# Patient Record
Sex: Female | Born: 1995 | Race: Black or African American | Hispanic: No | Marital: Single | Smoking: Never smoker
Health system: Southern US, Community
[De-identification: ages and names within clinical notes are randomized; demographics above are authoritative.]

---
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
## 2015-01-03 ENCOUNTER — Emergency Department (HOSPITAL_COMMUNITY)
Admission: EM | Admit: 2015-01-03 | Discharge: 2015-01-03 | Disposition: A | Discharge status: Home / Self Care | Payer: Medicaid Other | Attending: Emergency Medicine | Admitting: Emergency Medicine

## 2015-01-03 ENCOUNTER — Encounter (HOSPITAL_COMMUNITY): Payer: Self-pay | Admitting: Emergency Medicine

## 2015-01-03 DIAGNOSIS — L0211 Cutaneous abscess of neck: Secondary | ICD-10-CM | POA: Insufficient documentation

## 2015-01-03 DIAGNOSIS — Z792 Long term (current) use of antibiotics: Secondary | ICD-10-CM | POA: Insufficient documentation

## 2015-01-03 DIAGNOSIS — Z79899 Other long term (current) drug therapy: Secondary | ICD-10-CM | POA: Diagnosis not present

## 2015-01-03 DIAGNOSIS — R220 Localized swelling, mass and lump, head: Secondary | ICD-10-CM | POA: Diagnosis present

## 2015-01-03 MED ORDER — LIDOCAINE HCL 2 % IJ SOLN
INTRAMUSCULAR | Status: AC
Start: 2015-01-03 — End: 2015-01-03
  Administered 2015-01-03: 400 mg
  Filled 2015-01-03: qty 20

## 2015-01-03 MED ORDER — TRAMADOL HCL 50 MG PO TABS: 50.0000 mg | ORAL_TABLET | Freq: Four times a day (QID) | ORAL | Status: DC | PRN

## 2015-01-03 MED ORDER — LIDOCAINE HCL 2 % IJ SOLN: 20.0000 mL | Freq: Once | INTRAMUSCULAR | Status: DC

## 2015-01-03 MED ORDER — LIDOCAINE HCL 2 % IJ SOLN: 10.0000 mL | Freq: Once | INTRAMUSCULAR | Status: DC

## 2015-01-03 MED ORDER — SULFAMETHOXAZOLE-TRIMETHOPRIM 800-160 MG PO TABS: 1.0000 | ORAL_TABLET | Freq: Two times a day (BID) | ORAL | Status: AC

## 2015-01-03 NOTE — Discharge Instructions (Signed)
Abscess °Care After °An abscess (also called a boil or furuncle) is an infected area that contains a collection of pus. Signs and symptoms of an abscess include pain, tenderness, redness, or hardness, or you may feel a moveable soft area under your skin. An abscess can occur anywhere in the body. The infection may spread to surrounding tissues causing cellulitis. A cut (incision) by the surgeon was made over your abscess and the pus was drained out. Gauze may have been packed into the space to provide a drain that will allow the cavity to heal from the inside outwards. The boil may be painful for 5 to 7 days. Most people with a boil do not have high fevers. Your abscess, if seen early, may not have localized, and may not have been lanced. If not, another appointment may be required for this if it does not get better on its own or with medications. °HOME CARE INSTRUCTIONS  °· Only take over-the-counter or prescription medicines for pain, discomfort, or fever as directed by your caregiver. °· When you bathe, soak and then remove gauze or iodoform packs at least daily or as directed by your caregiver. You may then wash the wound gently with mild soapy water. Repack with gauze or do as your caregiver directs. °SEEK IMMEDIATE MEDICAL CARE IF:  °· You develop increased pain, swelling, redness, drainage, or bleeding in the wound site. °· You develop signs of generalized infection including muscle aches, chills, fever, or a general ill feeling. °· An oral temperature above 102° F (38.9° C) develops, not controlled by medication. °See your caregiver for a recheck if you develop any of the symptoms described above. If medications (antibiotics) were prescribed, take them as directed. °Document Released: 04/12/2005 Document Revised: 12/17/2011 Document Reviewed: 12/08/2007 °ExitCare® Patient Information ©2015 [employer]. This information is not intended to replace advice given to you by your health care provider. Make sure  you discuss any questions you have with your health care provider. ° °Cellulitis °Cellulitis is an infection of the skin and the tissue under the skin. The infected area is usually red and tender. This happens most often in the arms and lower legs. °HOME CARE  °· Take your antibiotic medicine as told. Finish the medicine even if you start to feel better. °· Keep the infected arm or leg raised (elevated). °· Put a warm cloth on the area up to 4 times per day. °· Only take medicines as told by your doctor. °· Keep all doctor visits as told. °GET HELP IF: °· You see red streaks on the skin coming from the infected area. °· Your red area gets bigger or turns a dark color. °· Your bone or joint under the infected area is painful after the skin heals. °· Your infection comes back in the same area or different area. °· You have a puffy (swollen) bump in the infected area. °· You have new symptoms. °· You have a fever. °GET HELP RIGHT AWAY IF:  °· You feel very sleepy. °· You throw up (vomit) or have watery poop (diarrhea). °· You feel sick and have muscle aches and pains. °MAKE SURE YOU:  °· Understand these instructions. °· Will watch your condition. °· Will get help right away if you are not doing well or get worse. °Document Released: 03/12/2008 Document Revised: 02/08/2014 Document Reviewed: 12/10/2011 °ExitCare® Patient Information ©2015 [employer]. This information is not intended to replace advice given to you by your health care provider. Make sure you discuss   any questions you have with your health care provider. ° °

## 2015-01-03 NOTE — ED Provider Notes (Signed)
CSN: [id number]     Arrival date & time 01/03/15  1052 History   First MD Initiated Contact with Patient 01/03/15 1056     Chief Complaint  Patient presents with  . Facial Swelling    pain in swollen area behind l/ear     (Consider location/radiation/quality/duration/timing/severity/associated sxs/prior Treatment) HPI Pt is an 19yo female presenting to ED with c/o gradually worsening mass behind her left ear that started about 18 hours ago.  Pt states the mass and pain have been worsening. Pain is aching, throbbing, and sharp, 10/10, worse with palpation. Denies bleeding or drainage from mass. Denies inner ear pain. Denies dental pain. No fever, chills, n/v/d. Denies hx of similar symptoms. Pt does report having her hair braided tightly about 3 weeks ago but no chemicals were used. No new soaps or lotions. Denies new ear piercing.   History reviewed. No pertinent past medical history. History reviewed. No pertinent past surgical history. History reviewed. No pertinent family history. History  Substance Use Topics  . Smoking status: Never Smoker   . Smokeless tobacco: Never Used  . Alcohol Use: No   OB History    No data available     Review of Systems  Constitutional: Negative for fever and chills.  HENT: Negative for sore throat and trouble swallowing.   Respiratory: Negative for shortness of breath.   Gastrointestinal: Negative for nausea and vomiting.  Musculoskeletal: Positive for neck pain ( left side, behind left ear). Negative for myalgias, back pain, arthralgias and neck stiffness.  Skin: Positive for color change and wound.  All other systems reviewed and are negative.     Allergies  Review of patient's allergies indicates no known allergies.  Home Medications   Prior to Admission medications   Medication Sig Start Date End Date Taking? Authorizing Provider  ibuprofen (ADVIL,MOTRIN) 800 MG tablet Take 1 tablet (800 mg total) by mouth every 8 (eight) hours as  needed. Patient not taking: Reported on 11/03/2014 05/24/14   Charlestine Nighthristopher Lawyer, PA-C  medroxyPROGESTERone (DEPO-PROVERA) 150 MG/ML injection Inject 150 mg into the muscle every 3 (three) months.    Historical Provider, MD  sulfamethoxazole-trimethoprim (BACTRIM DS,SEPTRA DS) 800-160 MG per tablet Take 1 tablet by mouth 2 (two) times daily. 01/03/15 01/10/15  Junius FinnerErin O'Malley, PA-C  traMADol (ULTRAM) 50 MG tablet Take 1 tablet (50 mg total) by mouth every 6 (six) hours as needed. 01/03/15   Junius FinnerErin O'Malley, PA-C   BP 126/77 mmHg  Pulse 92  Temp(Src) 98.3 F (36.8 C) (Oral)  Resp 16  SpO2 100%  LMP 12/13/2014 (Exact Date) Physical Exam  Constitutional: She is oriented to person, place, and time. She appears well-developed and well-nourished.  HENT:  Head: Normocephalic and atraumatic.  Right Ear: Hearing, tympanic membrane, external ear and ear canal normal.  Left Ear: Hearing, tympanic membrane, external ear and ear canal normal.  Eyes: EOM are normal.  Neck: Normal range of motion. Neck supple.  Mass behind left ear, see skin exam  Cardiovascular: Normal rate.   Pulmonary/Chest: Effort normal.  Musculoskeletal: Normal range of motion.  Neurological: She is alert and oriented to person, place, and time.  Skin: Skin is warm and dry. There is erythema.  1cm round tender, erythematous mass behind left ear. Mild fluctuance in center of lesion. No active drainage or bleeding. No red streaking.  Psychiatric: She has a normal mood and affect. Her behavior is normal.  Nursing note and vitals reviewed.   ED Course  Procedures   INCISION  AND DRAINAGE Performed by: Junius Finner A. Consent: Verbal consent obtained. Risks and benefits: risks, benefits and alternatives were discussed Type: abscess  Body area: behind left ear  Anesthesia: local infiltration  Incision was made with a scalpel.  Local anesthetic: lidocaine 2% without epinephrine  Anesthetic total: 0.5 ml  Complexity:  complex Blunt dissection to break up loculations  Drainage: bloody  Drainage amount: scant  Packing material: bandage placed  Patient tolerance: Patient tolerated the procedure well with no immediate complications.     Labs Review Labs Reviewed - No data to display  Imaging Review No results found.   EKG Interpretation None      MDM   Final diagnoses:  Abscess of skin of neck   Pt presenting to ED with c/o gradually worsening painful mass behind her left ear.  Inner ear appears normal. No erythema or bulging.  Discussed pt with Dr. Estell Harpin who also examined mass. Recommended performing and I&D as mass c/w small abscess.  Bloody discharge during procedure.  Discussed use of warm compresses. Will also start pt on bactrim and have f/u with PCP, Holland Commons in 2 days for wound recheck. Pt verbalized understanding and agreement with tx plan.   Junius Finner, PA-C 01/03/15 1140  Bethann Berkshire, MD 01/04/15 [phone]

## 2015-01-03 NOTE — ED Notes (Signed)
Pt c/o small swollen, tender area behind l/ear x 18 hours

## 2015-01-03 NOTE — ED Notes (Signed)
Patient was having a video conversation while this tech was getting vitals.

## 2015-01-12 ENCOUNTER — Ambulatory Visit: Admitting: Internal Medicine

## 2015-06-30 ENCOUNTER — Encounter (HOSPITAL_COMMUNITY): Payer: Self-pay

## 2015-06-30 ENCOUNTER — Emergency Department (HOSPITAL_COMMUNITY)
Admission: EM | Admit: 2015-06-30 | Discharge: 2015-06-30 | Disposition: A | Discharge status: Home / Self Care | Payer: Medicaid Other | Attending: Emergency Medicine | Admitting: Emergency Medicine

## 2015-06-30 DIAGNOSIS — N390 Urinary tract infection, site not specified: Secondary | ICD-10-CM | POA: Insufficient documentation

## 2015-06-30 DIAGNOSIS — Z79899 Other long term (current) drug therapy: Secondary | ICD-10-CM | POA: Diagnosis not present

## 2015-06-30 DIAGNOSIS — R300 Dysuria: Secondary | ICD-10-CM | POA: Diagnosis present

## 2015-06-30 LAB — URINE MICROSCOPIC-ADD ON

## 2015-06-30 LAB — URINALYSIS, ROUTINE W REFLEX MICROSCOPIC
BILIRUBIN URINE: NEGATIVE
Glucose, UA: NEGATIVE mg/dL
Ketones, ur: NEGATIVE mg/dL
Nitrite: NEGATIVE
PH: 6 (ref 5.0–8.0)
Protein, ur: 100 mg/dL — AB
SPECIFIC GRAVITY, URINE: 1.036 — AB (ref 1.005–1.030)
Urobilinogen, UA: 1 mg/dL (ref 0.0–1.0)

## 2015-06-30 MED ORDER — CEPHALEXIN 500 MG PO CAPS
500.0000 mg | ORAL_CAPSULE | Freq: Once | ORAL | Status: AC
  Administered 2015-06-30: 500 mg via ORAL
  Filled 2015-06-30: qty 1

## 2015-06-30 MED ORDER — FLUCONAZOLE 150 MG PO TABS
150.0000 mg | ORAL_TABLET | Freq: Once | ORAL | Status: AC
  Administered 2015-06-30: 150 mg via ORAL
  Filled 2015-06-30: qty 1

## 2015-06-30 MED ORDER — CEPHALEXIN 500 MG PO CAPS
500.0000 mg | ORAL_CAPSULE | Freq: Three times a day (TID) | ORAL | Status: DC
Start: 2015-06-30 — End: 2015-08-28

## 2015-06-30 NOTE — ED Notes (Signed)
Pt with increased urinary frequency. Decreased volume x 2 days

## 2015-06-30 NOTE — ED Provider Notes (Signed)
CSN: [id number]     Arrival date & time 06/30/15  1706 History   First MD Initiated Contact with Patient 06/30/15 1801     Chief Complaint  Patient presents with  . Dysuria     (Consider location/radiation/quality/duration/timing/severity/associated sxs/prior Treatment) HPI  19 yo F w/ 2 days of worsening decreased uop and urgency. No fevers, nausea, vomiting. No back pain, suprapubic pain or other associated symptoms.   History reviewed. No pertinent past medical history. History reviewed. No pertinent past surgical history. History reviewed. No pertinent family history. Social History  Substance Use Topics  . Smoking status: Never Smoker   . Smokeless tobacco: Never Used  . Alcohol Use: No   OB History    No data available     Review of Systems  Constitutional: Negative for fever, chills and activity change.  Gastrointestinal: Negative for diarrhea.  Genitourinary: Positive for dysuria, decreased urine volume and difficulty urinating. Negative for urgency, pelvic pain and dyspareunia.  All other systems reviewed and are negative.     Allergies  Review of patient's allergies indicates no known allergies.  Home Medications   Prior to Admission medications   Medication Sig Start Date End Date Taking? Authorizing Provider  levonorgestrel (MIRENA) 20 MCG/24HR IUD 1 each by Intrauterine route once.   Yes Historical Provider, MD  Multiple Vitamin (MULTIVITAMIN WITH MINERALS) TABS tablet Take 1 tablet by mouth daily.   Yes Historical Provider, MD  cephALEXin (KEFLEX) 500 MG capsule Take 1 capsule (500 mg total) by mouth 3 (three) times daily. 06/30/15   Marily Memos, MD   BP 126/83 mmHg  Pulse 80  Temp(Src) 98.1 F (36.7 C) (Oral)  Resp 16  Ht  (1.676 m)  Wt 119 lb (53.978 kg)  BMI 19.22 kg/m2  SpO2 99% Physical Exam  Constitutional: She is oriented to person, place, and time. She appears well-developed and well-nourished.  HENT:  Head: Normocephalic and  atraumatic.  Eyes: Conjunctivae and EOM are normal. Right eye exhibits no discharge. Left eye exhibits no discharge.  Cardiovascular: Normal rate and regular rhythm.   Pulmonary/Chest: Effort normal and breath sounds normal. No respiratory distress.  Abdominal: Soft. She exhibits no distension. There is no tenderness. There is no rebound.  Musculoskeletal: Normal range of motion. She exhibits no edema or tenderness.  Neurological: She is alert and oriented to person, place, and time.  Skin: Skin is warm and dry.  Nursing note and vitals reviewed.   ED Course  Procedures (including critical care time) Labs Review Labs Reviewed  URINALYSIS, ROUTINE W REFLEX MICROSCOPIC (NOT AT [facility]) - Abnormal; Notable for the following:    Color, Urine AMBER (*)    APPearance TURBID (*)    Specific Gravity, Urine 1.036 (*)    Hgb urine dipstick LARGE (*)    Protein, ur 100 (*)    Leukocytes, UA LARGE (*)    All other components within normal limits  URINE MICROSCOPIC-ADD ON - Abnormal; Notable for the following:    Bacteria, UA FEW (*)    All other components within normal limits  POC URINE PREG, ED    Imaging Review No results found. I have personally reviewed and evaluated these images and lab results as part of my medical decision-making.   EKG Interpretation None      MDM   Final diagnoses:  UTI (lower urinary tract infection)   Here with UTI. Also with blood so will need PCP follow up. No pain to suggest renal colic.  I have personally and contemperaneously reviewed labs and imaging and used in my decision making as above.   A medical screening exam was performed and I feel the patient has had an appropriate workup for their chief complaint at this time and likelihood of emergent condition existing is low. They have been counseled on decision, discharge, follow up and which symptoms necessitate immediate return to the emergency department. They or their family verbally stated  understanding and agreement with plan and discharged in stable condition.      Marily Memos, MD 06/30/15 [phone]

## 2015-08-28 ENCOUNTER — Emergency Department (HOSPITAL_COMMUNITY)
Admission: EM | Admit: 2015-08-28 | Discharge: 2015-08-28 | Disposition: A | Discharge status: Home / Self Care | Payer: Medicaid Other | Attending: Emergency Medicine | Admitting: Emergency Medicine

## 2015-08-28 ENCOUNTER — Encounter (HOSPITAL_COMMUNITY): Payer: Self-pay | Admitting: Oncology

## 2015-08-28 ENCOUNTER — Emergency Department (HOSPITAL_COMMUNITY): Payer: Medicaid Other

## 2015-08-28 DIAGNOSIS — W228XXA Striking against or struck by other objects, initial encounter: Secondary | ICD-10-CM | POA: Insufficient documentation

## 2015-08-28 DIAGNOSIS — Y9389 Activity, other specified: Secondary | ICD-10-CM | POA: Diagnosis not present

## 2015-08-28 DIAGNOSIS — Y998 Other external cause status: Secondary | ICD-10-CM | POA: Insufficient documentation

## 2015-08-28 DIAGNOSIS — S6992XA Unspecified injury of left wrist, hand and finger(s), initial encounter: Secondary | ICD-10-CM | POA: Diagnosis present

## 2015-08-28 DIAGNOSIS — S60411A Abrasion of left index finger, initial encounter: Secondary | ICD-10-CM | POA: Insufficient documentation

## 2015-08-28 DIAGNOSIS — S60222A Contusion of left hand, initial encounter: Secondary | ICD-10-CM | POA: Diagnosis not present

## 2015-08-28 DIAGNOSIS — Y9289 Other specified places as the place of occurrence of the external cause: Secondary | ICD-10-CM | POA: Insufficient documentation

## 2015-08-28 DIAGNOSIS — S60512A Abrasion of left hand, initial encounter: Secondary | ICD-10-CM

## 2015-08-28 MED ORDER — ACETAMINOPHEN 500 MG PO TABS: 1000.0000 mg | ORAL_TABLET | Freq: Once | ORAL | Status: DC

## 2015-08-28 MED ORDER — BACITRACIN ZINC 500 UNIT/GM EX OINT: 1.0000 "application " | TOPICAL_OINTMENT | Freq: Two times a day (BID) | CUTANEOUS | Status: DC

## 2015-08-28 NOTE — Discharge Instructions (Signed)
1. Medications: bacitracin, usual home medications 2. Treatment: rest, drink plenty of fluids, keep wound clean and dry, use ice 3. Follow Up: Please followup with your primary doctor in 3 days for discussion of your diagnoses and further evaluation after today's visit; if you do not have a primary care doctor use the resource guide provided to find one; Please return to the ER for worsening symptoms, symptoms of infection   Abrasion An abrasion is a cut or scrape on the outer surface of your skin. An abrasion does not extend through all of the layers of your skin. It is important to care for your abrasion properly to prevent infection. CAUSES Most abrasions are caused by falling on or gliding across the ground or another surface. When your skin rubs on something, the outer and inner layer of skin rubs off.  SYMPTOMS A cut or scrape is the main symptom of this condition. The scrape may be bleeding, or it may appear red or pink. If there was an associated fall, there may be an underlying bruise. DIAGNOSIS An abrasion is diagnosed with a physical exam. TREATMENT Treatment for this condition depends on how large and deep the abrasion is. Usually, your abrasion will be cleaned with water and mild soap. This removes any dirt or debris that may be stuck. An antibiotic ointment may be applied to the abrasion to help prevent infection. A bandage (dressing) may be placed on the abrasion to keep it clean. You may also need a tetanus shot. HOME CARE INSTRUCTIONS Medicines  Take or apply medicines only as directed by your health care provider.  If you were prescribed an antibiotic ointment, finish all of it even if you start to feel better. Wound Care  Clean the wound with mild soap and water 2-3 times per day or as directed by your health care provider. Pat your wound dry with a clean towel. Do not rub it.  There are many different ways to close and cover a wound. Follow instructions from your health  care provider about:  Wound care.  Dressing changes and removal.  Check your wound every day for signs of infection. Watch for:  Redness, swelling, or pain.  Fluid, blood, or pus. General Instructions  Keep the dressing dry as directed by your health care provider. Do not take baths, swim, use a hot tub, or do anything that would put your wound underwater until your health care provider approves.  If there is swelling, raise (elevate) the injured area above the level of your heart while you are sitting or lying down.  Keep all follow-up visits as directed by your health care provider. This is important. SEEK MEDICAL CARE IF:  You received a tetanus shot and you have swelling, severe pain, redness, or bleeding at the injection site.  Your pain is not controlled with medicine.  You have increased redness, swelling, or pain at the site of your wound. SEEK IMMEDIATE MEDICAL CARE IF:  You have a red streak going away from your wound.  You have a fever.  You have fluid, blood, or pus coming from your wound.  You notice a bad smell coming from your wound or your dressing.   This information is not intended to replace advice given to you by your health care provider. Make sure you discuss any questions you have with your health care provider.   Document Released: 07/04/2005 Document Revised: 06/15/2015 Document Reviewed: 09/22/2014 Elsevier Interactive Patient Education [employer]2016 Elsevier Inc.

## 2015-08-28 NOTE — ED Notes (Addendum)
Pt presents d/t left handed pain after hitting a wall because she was mad.  Pt states that it feels like she has a rubber band around her thumb.  Pain is rated 8/10, throbbing in nature. No obvious deformity.  Cap refill WNL.

## 2015-08-28 NOTE — ED Provider Notes (Signed)
CSN: [id number]     Arrival date & time 08/28/15  0035 History   First MD Initiated Contact with Patient 08/28/15 0109     Chief Complaint  Patient presents with  . Hand Injury     (Consider location/radiation/quality/duration/timing/severity/associated sxs/prior Treatment) Patient is a 19 y.o. female presenting with hand injury. The history is provided by the patient and medical records. No language interpreter was used.  Hand Injury Associated symptoms: no back pain, no fever and no neck pain      Peggy Weeks is a 19 y.o. female  with no major medical history presents to the Emergency Department complaining of gradual, persistent, progressively worsening left hand pain after punching a wall onset 3 hours prior to arrival.  Patient describes the pain as throbbing, rated at an 8 out of 10. Patient also reports pain to the left thumb.  Patient with abrasion over the MCP of the left pointer finger. Patient is adamant that this is not a fight bite. She states multiple times that she only punched a wall. Treatments prior to arrival. Nothing makes it better or worse. Patient denies numbness, tingling, weakness. Patient does not know of her tetanus is up-to-date.   History reviewed. No pertinent past medical history. History reviewed. No pertinent past surgical history. No family history on file. Social History  Substance Use Topics  . Smoking status: Never Smoker   . Smokeless tobacco: Never Used  . Alcohol Use: No   OB History    No data available     Review of Systems  Constitutional: Negative for fever and chills.  Gastrointestinal: Negative for nausea and vomiting.  Musculoskeletal: Positive for arthralgias. Negative for back pain, joint swelling, neck pain and neck stiffness.  Skin: Positive for wound.  Neurological: Negative for numbness.  Hematological: Does not bruise/bleed easily.  Psychiatric/Behavioral: The patient is not nervous/anxious.   All other systems reviewed  and are negative.     Allergies  Review of patient's allergies indicates no known allergies.  Home Medications   Prior to Admission medications   Medication Sig Start Date End Date Taking? Authorizing Provider  bacitracin ointment Apply 1 application topically 2 (two) times daily. 08/28/15   Yadira Hada, PA-C  levonorgestrel (MIRENA) 20 MCG/24HR IUD 1 each by Intrauterine route once.    Historical Provider, MD   BP 135/83 mmHg  Pulse 81  Temp(Src) 97.8 F (36.6 C) (Oral)  Resp 17  SpO2 100% Physical Exam  Constitutional: She appears well-developed and well-nourished. No distress.  HENT:  Head: Normocephalic and atraumatic.  Eyes: Conjunctivae are normal.  Neck: Normal range of motion.  Cardiovascular: Normal rate, regular rhythm and intact distal pulses.   Capillary refill < 3 sec  Pulmonary/Chest: Effort normal and breath sounds normal.  Musculoskeletal: She exhibits tenderness. She exhibits no edema.  ROM: Full range of motion of the left shoulder, elbow, wrist and all fingers of the left hand Tenderness to palpation over the MCP and along the medial aspect of the left thumb without palpable deformity  Neurological: She is alert. Coordination normal.  Sensation intact to dull and sharp Strength 5/5 including strong grip strength and flexion and extension of all fingers  Skin: Skin is warm and dry. She is not diaphoretic.  No tenting of the skin Abrasion noted over the dorsal side of the MCP of the left pointer finger  Psychiatric: She has a normal mood and affect.  Nursing note and vitals reviewed.   ED Course  Procedures (  including critical care time) Labs Review Labs Reviewed - No data to display  Imaging Review Dg Hand Complete Left  08/28/2015  CLINICAL DATA:  Left hand/thumb pain after injury.  Punched a wall. EXAM: LEFT HAND - COMPLETE 3+ VIEW COMPARISON:  None. FINDINGS: No fracture or dislocation. The alignment and joint spaces are maintained.  Particularly, the thumb is intact. No focal soft tissue abnormality. IMPRESSION: No fracture dislocation of the left hand. Electronically Signed   By: Rubye OaksMelanie  Ehinger M.D.   On: 08/28/2015 01:16   I have personally reviewed and evaluated these images and lab results as part of my medical decision-making.   EKG Interpretation None      MDM   Final diagnoses:  Contusion of left hand, initial encounter  Abrasion of left hand, initial encounter   Peggy Weeks presents with left hand pain after punching a wall tonight. Abrasion over the left MCP of the pointer finger.  Patient is adamant that this is not a fight bite. Unknown last tetanus however patient refuses to have her tetanus updated today. She asked multiple times for narcotic pain medications however I do not believe that this is the safest way to treat her pain. Tylenol given in the emergency department.. No fractures noted on x-ray. Recommend conservative treatments. Prescription given for bacitracin for her abrasion. Should return precautions given for infection.  BP 135/83 mmHg  Pulse 81  Temp(Src) 97.8 F (36.6 C) (Oral)  Resp 17  SpO2 100%    Dierdre ForthHannah Petrea Fredenburg, PA-C 08/28/15 0143  Tomasita CrumbleAdeleke Oni, MD 08/28/15 [phone]

## 2015-08-28 NOTE — ED Notes (Signed)
Pt to xray

## 2015-11-18 ENCOUNTER — Encounter: Payer: Self-pay | Admitting: Internal Medicine

## 2015-11-18 ENCOUNTER — Ambulatory Visit: Payer: Medicaid Other | Attending: Internal Medicine | Admitting: Internal Medicine

## 2015-11-18 VITALS — BP 140/97 | HR 81 | Temp 98.0°F | Resp 16 | Ht 66.0 in | Wt 114.0 lb

## 2015-11-18 DIAGNOSIS — F32A Depression, unspecified: Secondary | ICD-10-CM

## 2015-11-18 DIAGNOSIS — F329 Major depressive disorder, single episode, unspecified: Secondary | ICD-10-CM | POA: Diagnosis not present

## 2015-11-18 NOTE — Progress Notes (Signed)
Patient here for follow up Patient states she has been having a really hard time trying to control her emotions There are days when she feel low and other days when she feels great Cant seem to explain what is going on Has lost weight and has a decreased appetite as well

## 2015-11-18 NOTE — Progress Notes (Signed)
Patient ID: Peggy Weeks, female   DOB: [birth year], 20 y.o.   MRN: [id number]  CC: Depression  HPI: Peggy Weeks is a 20 y.o. female here today for a follow up visit.  Patient has no past medical history. Patient is a current Archivist at [employer]. Patient states that she has been having a hard time concentrating and focusing. She feels very depressed and having crying spells. Patient reports these same symptoms for over one year. She was seen last year for similar symptoms but only went to her psychiatrist at [facility] twice because she did not like the facility. She would like a new referral today. She denies thoughts of suicide. She has lost weight and decreased appetite as well.  No Known Allergies History reviewed. No pertinent past medical history. Current Outpatient Prescriptions on File Prior to Visit  Medication Sig Dispense Refill  . bacitracin ointment Apply 1 application topically 2 (two) times daily. 120 g 0  . levonorgestrel (MIRENA) 20 MCG/24HR IUD 1 each by Intrauterine route once.     No current facility-administered medications on file prior to visit.   History reviewed. No pertinent family history. Social History   Social History  . Marital Status: Single    Spouse Name: N/A  . Number of Children: N/A  . Years of Education: N/A   Occupational History  . Not on file.   Social History Main Topics  . Smoking status: Never Smoker   . Smokeless tobacco: Never Used  . Alcohol Use: No  . Drug Use: No  . Sexual Activity: Yes    Birth Control/ Protection: Implant   Other Topics Concern  . Not on file   Social History Narrative    Review of Systems: Other than what is stated in HPI, all other systems are negative.   Objective:   Filed Vitals:   11/18/15 1451  BP: 140/97  Pulse: 81  Temp: 98 F (36.7 C)  Resp: 16    Physical Exam  Constitutional: She is oriented to person, place, and time.   Cardiovascular: Normal rate, regular rhythm and normal heart sounds.   Pulmonary/Chest: Effort normal and breath sounds normal.  Neurological: She is alert and oriented to person, place, and time.  Skin: Skin is warm and dry.  Psychiatric: She has a normal mood and affect.  Depressed    No results found for: WBC, HGB, HCT, MCV, PLT No results found for: CREATININE, BUN, NA, K, CL, CO2  No results found for: HGBA1C Lipid Panel  No results found for: CHOL, TRIG, HDL, CHOLHDL, VLDL, LDLCALC     Assessment and plan:   Lani was seen today for follow-up.  Diagnoses and all orders for this visit:  Depression -     Ambulatory referral to Psychiatry I will see new referral to [facility] of care. Referred patient to social worker to set up an appointment. Social worker will follow up with patient in one week. Patient given information to mobile crisis line.  Return if symptoms worsen or fail to improve.       Ambrose Finland, NP-C [facility] [phone] 11/18/2015, 3:02 PM

## 2015-11-18 NOTE — Patient Instructions (Signed)
[facility] ? Address: [address] [place], [place] [address] Phone: [phone]

## 2015-11-23 ENCOUNTER — Encounter: Payer: Self-pay | Admitting: Clinical

## 2015-11-23 NOTE — Progress Notes (Signed)
[facility] has contacted Ms. Cousins, and informed her that when she transfers her Medicaid from [facility] to [facility], to contact [facility], to set up her initial appointment. [facility] does not have a contract with [facility], and are unable to take her as a patient with Medicaid coverage from [facility].

## 2015-12-29 ENCOUNTER — Encounter: Payer: Self-pay | Admitting: Clinical

## 2015-12-29 NOTE — Progress Notes (Signed)
Per [facility], [facility] [phone], [phone]. [phone]), Ms. Zingg was [facility] for [facility], as she has family planning Medicaid, not full Medicaid, and services at [facility] are not covered by family planning Medicaid.

## 2016-01-12 ENCOUNTER — Telehealth: Payer: Self-pay | Admitting: Clinical

## 2016-01-12 NOTE — Telephone Encounter (Signed)
F/u with pt, she has not gone to [facility] yet, says "they won't take my family planning Medicaid". Pt is informed that while it may be true that [facility] does not take family planning Medicaid, she can go to [facility] for her [facility] meds even if she were completely uninsured. Pt says that she will go to [facility] as a walk-in, but uncertain which day.

## 2016-12-14 ENCOUNTER — Encounter (HOSPITAL_COMMUNITY): Payer: Self-pay | Admitting: Nurse Practitioner

## 2016-12-14 ENCOUNTER — Emergency Department (HOSPITAL_COMMUNITY)
Admission: EM | Admit: 2016-12-14 | Discharge: 2016-12-14 | Disposition: A | Discharge status: Home / Self Care | Payer: Self-pay | Attending: Emergency Medicine | Admitting: Emergency Medicine

## 2016-12-14 DIAGNOSIS — H2000 Unspecified acute and subacute iridocyclitis: Secondary | ICD-10-CM | POA: Insufficient documentation

## 2016-12-14 MED ORDER — FLUORESCEIN SODIUM 0.6 MG OP STRP
1.0000 | ORAL_STRIP | Freq: Once | OPHTHALMIC | Status: AC
  Administered 2016-12-14: 1 via OPHTHALMIC

## 2016-12-14 MED ORDER — CYCLOPENTOLATE HCL 1 % OP SOLN
2.0000 [drp] | Freq: Once | OPHTHALMIC | Status: AC
  Administered 2016-12-14: 2 [drp] via OPHTHALMIC
  Filled 2016-12-14: qty 2

## 2016-12-14 MED ORDER — TETRACAINE HCL 0.5 % OP SOLN
1.0000 [drp] | Freq: Once | OPHTHALMIC | Status: AC
  Administered 2016-12-14: 1 [drp] via OPHTHALMIC
  Filled 2016-12-14: qty 4

## 2016-12-14 NOTE — ED Notes (Signed)
Visual Acuity  Bilateral Distance: 20/40  Right Distance: 20/40  Left Distance: 20/40

## 2016-12-14 NOTE — ED Triage Notes (Addendum)
Pt c/o severe right eye pain, and generalized body aches, remarks on a sudden onset yesterday. Obvious moderate watery and inflamed/redness in the eye. Please see documented acuity check, pt wears collective lens.

## 2016-12-14 NOTE — ED Notes (Signed)
Called pt for triage no response. 

## 2016-12-14 NOTE — Discharge Instructions (Signed)
Dr. Sherryll BurgerShah will see you tomorrow morning in his office at [facility] on [address] at 9 am.  He asked that you call him on his cell phone #[phone] if the office door is locked when you arrive.  He is probably there seeing another patient.  Also, he asked for a call if you decide you cannot be there, but is highly suggested you have him see you.  Do not wear your contacts.  Take the dilating drop with you to your appointment.  You will probably need to wear sunglasses as you will be more light sensitive since your eyes were dilated.

## 2016-12-14 NOTE — ED Provider Notes (Signed)
[facility]-EMERGENCY DEPT Provider Note   CSN: [id number] Arrival date & time: 12/14/16  1459  By signing my name below, I, Teofilo PodMatthew P. Jamison, attest that this documentation has been prepared under the direction and in the presence of Burgess AmorJulie Carime Dinkel, PA-C. Electronically Signed: Teofilo PodMatthew P. Jamison, ED Scribe. 12/14/2016. 4:18 PM.   History   Chief Complaint Chief Complaint  Patient presents with  . Eye Pain    Right Eye   The history is provided by the patient. No language interpreter was used.   HPI Comments:  Peggy Weeks is a 21 y.o. female who presents to the Emergency Department complaining of constant right eye pain since yesterday in addition to copious tear drainage, but no thick discharge.  She denies a foreign body sensation. Pt states that when she woke up yesterday her right eye was hurting. Pt wears extended wear (30 day, changed them out 2 weeks ago) contact lenses and states that she wore her contact lenses to sleep. Pt complains of associated eye redness and photophobia. Pt is not wearing her contacts at this time.  She replaces her contact lenses monthly. She states that she is otherwise healthy. No known allergies to medication. Pt denies visual changes, eye drainage.   History reviewed. No pertinent past medical history.  There are no active problems to display for this patient.   History reviewed. No pertinent surgical history.  OB History    No data available       Home Medications    Prior to Admission medications   Medication Sig Start Date End Date Taking? Authorizing Provider  bacitracin ointment Apply 1 application topically 2 (two) times daily. 08/28/15   Hannah Muthersbaugh, PA-C  levonorgestrel (MIRENA) 20 MCG/24HR IUD 1 each by Intrauterine route once.    Historical Provider, MD    Family History History reviewed. No pertinent family history.  Social History Social History  Substance Use Topics  . Smoking status: Never Smoker  . Smokeless  tobacco: Never Used  . Alcohol use No     Allergies   Patient has no known allergies.   Review of Systems Review of Systems  Constitutional: Negative for fever.  HENT: Negative.  Negative for congestion, facial swelling and sinus pressure.   Eyes: Positive for photophobia, pain and redness. Negative for discharge and visual disturbance.  Gastrointestinal: Negative for nausea and vomiting.  Skin: Negative.      Physical Exam Updated Vital Signs BP 110/66 (BP Location: Right Arm)   Pulse 74   Temp 98.7 F (37.1 C) (Oral)   Resp 18   Ht 5\' 5"  (1.651 m)   Wt 54.4 kg   SpO2 100%   BMI 19.97 kg/m   Physical Exam  Constitutional: She appears well-developed and well-nourished. No distress.  HENT:  Head: Normocephalic and atraumatic.  Eyes: EOM are normal. Pupils are equal, round, and reactive to light. Right eye exhibits no chemosis and no exudate. No foreign body present in the right eye. Left eye exhibits no chemosis and no exudate. No foreign body present in the left eye. Right conjunctiva is injected.  Slit lamp exam:      The right eye shows no corneal abrasion, no corneal flare, no corneal ulcer, no fluorescein uptake and no anterior chamber bulge.  Conjunctival injection and clear tearing from the right eye. IOP right eye: 2 and 6, measured x 2. Visual acuity: Right eye 20/40, Left eye 20/40  Consensual pain in the right eye with left eye light  exposure.  Cardiovascular: Normal rate.   Pulmonary/Chest: Effort normal.  Abdominal: She exhibits no distension.  Neurological: She is alert.  Skin: Skin is warm and dry.  Psychiatric: She has a normal mood and affect.  Nursing note and vitals reviewed.    ED Treatments / Results  DIAGNOSTIC STUDIES:  Oxygen Saturation is 97% on RA, normal by my interpretation.    COORDINATION OF CARE:  4:18 PM Discussed treatment plan with pt at bedside and pt agreed to plan.   Labs (all labs ordered are listed, but only abnormal  results are displayed) Labs Reviewed - No data to display  EKG  EKG Interpretation None       Radiology No results found.  Procedures Procedures (including critical care time)  Medications Ordered in ED Medications  fluorescein ophthalmic strip 1 strip (1 strip Right Eye Given 12/14/16 1628)  tetracaine (PONTOCAINE) 0.5 % ophthalmic solution 1 drop (1 drop Right Eye Given 12/14/16 1628)  cyclopentolate (CYCLODRYL,CYCLOGYL) 1 % ophthalmic solution 2 drop (2 drops Right Eye Given 12/14/16 1743)     Initial Impression / Assessment and Plan / ED Course  I have reviewed the triage vital signs and the nursing notes.  Pertinent labs & imaging results that were available during my care of the patient were reviewed by me and considered in my medical decision making (see chart for details).     Pt with exam findings suggesting acute iritis of unclear etiology, chronic contact lens user but no hx of trauma, no recent uri or infectious processes.  Discussed pt with Dr. Sherryll Burger of ophthalmology.  Agreed with close f/u - will see pt in his office tomorrow at 9 am.  Pt was given cycloplegic with greatly improved pain after dilation occurred. Pt understands and agrees with plan. Advised to not wear contacts. May need sunglasses due to increased sensitivity to light with dilation.  Final Clinical Impressions(s) / ED Diagnoses   Final diagnoses:  Acute iritis of right eye    New Prescriptions Discharge Medication List as of 12/14/2016  7:12 PM    I personally performed the services described in this documentation, which was scribed in my presence. The recorded information has been reviewed and is accurate.     Burgess Amor, PA-C 12/15/16 2137    Marily Memos, MD 12/16/16 1308

## 2017-04-03 ENCOUNTER — Emergency Department (HOSPITAL_COMMUNITY)
Admission: EM | Admit: 2017-04-03 | Discharge: 2017-04-03 | Disposition: A | Discharge status: Home / Self Care | Payer: Self-pay | Attending: Emergency Medicine | Admitting: Emergency Medicine

## 2017-04-03 ENCOUNTER — Encounter (HOSPITAL_COMMUNITY): Payer: Self-pay | Admitting: *Deleted

## 2017-04-03 DIAGNOSIS — J020 Streptococcal pharyngitis: Secondary | ICD-10-CM | POA: Insufficient documentation

## 2017-04-03 LAB — RAPID STREP SCREEN (MED CTR MEBANE ONLY): Streptococcus, Group A Screen (Direct): POSITIVE — AB

## 2017-04-03 MED ORDER — HYDROCODONE-ACETAMINOPHEN 5-325 MG PO TABS: 1.0000 | ORAL_TABLET | Freq: Four times a day (QID) | ORAL | 0 refills | Status: DC | PRN

## 2017-04-03 MED ORDER — ACETAMINOPHEN 500 MG PO TABS: 1000.0000 mg | ORAL_TABLET | Freq: Once | ORAL | Status: DC

## 2017-04-03 MED ORDER — HYDROCODONE-ACETAMINOPHEN 5-325 MG PO TABS
1.0000 | ORAL_TABLET | Freq: Once | ORAL | Status: AC
  Administered 2017-04-03: 1 via ORAL
  Filled 2017-04-03: qty 1

## 2017-04-03 MED ORDER — PENICILLIN G BENZATHINE 1200000 UNIT/2ML IM SUSP
1.2000 10*6.[IU] | Freq: Once | INTRAMUSCULAR | Status: AC
  Administered 2017-04-03: 1.2 10*6.[IU] via INTRAMUSCULAR
  Filled 2017-04-03: qty 2

## 2017-04-03 NOTE — Discharge Instructions (Signed)
Take Advil for mild pain or the pain medicine prescribed for bad pain. Call Dr. Jearld FentonByers to arrange to be seen in his office if not continuing to improve in 2 days. Return if you feel your throat closing, unable to swallow or if you feel worse for any reason

## 2017-04-03 NOTE — ED Provider Notes (Addendum)
[facility]-EMERGENCY DEPT Provider Note   CSN: [id number] Arrival date & time: 04/03/17  [id number]     History   Chief Complaint Chief Complaint  Patient presents with  . Oral Swelling    HPI Peggy Weeks is a 21 y.o. female.Complains of sore throat started yesterday pain is worse with swallowing no shortness of breath she is able to swallow but painful. Denies fever. Pain is the right side of her throat. No cough no other associated symptoms no shortness of breath. No treatment prior to coming here  HPI  History reviewed. No pertinent past medical history.  There are no active problems to display for this patient.   History reviewed. No pertinent surgical history.  OB History    No data available       Home Medications    Prior to Admission medications   Medication Sig Start Date End Date Taking? Authorizing Provider  bacitracin ointment Apply 1 application topically 2 (two) times daily. 08/28/15   Muthersbaugh, Dahlia ClientHannah, PA-C  levonorgestrel (MIRENA) 20 MCG/24HR IUD 1 each by Intrauterine route once.    [provider]    Family History History reviewed. No pertinent family history.  Social History Social History  Substance Use Topics  . Smoking status: Never Smoker  . Smokeless tobacco: Never Used  . Alcohol use No     Allergies   Patient has no known allergies.   Review of Systems Review of Systems  Constitutional: Negative.   HENT: Positive for sore throat. Negative for trouble swallowing and voice change.        Pain with swallowing but no trouble swallowing  Respiratory: Negative.   Cardiovascular: Negative.   Gastrointestinal: Negative.   Musculoskeletal: Negative.   Skin: Negative.   Neurological: Negative.   Psychiatric/Behavioral: Negative.   All other systems reviewed and are negative.    Physical Exam Updated Vital Signs BP 129/83 (BP Location: Left Arm)   Pulse 93   Temp 98.3 F (36.8 C) (Oral)   Resp 18   Ht 5\' 6"  (1.676 m)    Wt 54.4 kg (120 lb)   LMP 04/02/2017   SpO2 97%   BMI 19.37 kg/m   Physical Exam  Constitutional: She appears well-developed and well-nourished.  Appears mildly uncomfortable nontoxic-appearing  HENT:  Head: Normocephalic and atraumatic.  Right Ear: External ear normal.  Left Ear: External ear normal.  Mouth/Throat: Oropharyngeal exudate present.  Bilateral tympanic membranes normal. Bilateral tonsils reddened with white exudate right tonsil larger than left uvula midline  Eyes: Conjunctivae are normal. Pupils are equal, round, and reactive to light.  Neck: Neck supple. No tracheal deviation present. No thyromegaly present.  Cardiovascular: Normal rate and regular rhythm.   No murmur heard. Pulmonary/Chest: Effort normal and breath sounds normal.  Abdominal: Soft. Bowel sounds are normal. She exhibits no distension. There is no tenderness.  Musculoskeletal: Normal range of motion. She exhibits no edema or tenderness.  Lymphadenopathy:    She has cervical adenopathy.  Neurological: She is alert. Coordination normal.  Skin: Skin is warm and dry. No rash noted.  Psychiatric: She has a normal mood and affect.  Nursing note and vitals reviewed.    ED Treatments / Results  Labs (all labs ordered are listed, but only abnormal results are displayed) Labs Reviewed  RAPID STREP SCREEN (NOT AT [facility])    EKG  EKG Interpretation None      Results for orders placed or performed during the hospital encounter of 04/03/17  Rapid strep  screen (not at [facility])  Result Value Ref Range   Streptococcus, Group A Screen (Direct) POSITIVE (A) NEGATIVE   No results found. Radiology No results found.  Procedures Procedures (including critical care time)  Medications Ordered in ED Medications  acetaminophen (TYLENOL) tablet 1,000 mg (not administered)     Initial Impression / Assessment and Plan / ED Course  I have reviewed the triage vital signs and the nursing notes.  Pertinent  labs & imaging results that were available during my care of the patient were reviewed by me and considered in my medical decision making (see chart for details).     Pain improved after treatment with Norco and IM penicillin. She is handling secretions well. Nontoxic-appearing man prescription Norco. [place] Controlled Substance reporting System queriedAdvil for mild pain Referral ENT doctor [place]. Referral primary care  Final Clinical Impressions(s) / ED Diagnoses  Diagnosis strep pharyngitis Final diagnoses:  None    New Prescriptions New Prescriptions   No medications on file     Doug Sou, MD 04/03/17 1107    Doug Sou, MD 04/03/17 1108

## 2017-04-03 NOTE — ED Triage Notes (Signed)
Patient is alert and oriented x4.  She is complaining of oral swelling with difficulty swallowing.  Patient states that she started to feel throat pain and tightness last night.  She denies having this issue in the past and does not have any other medical issues.  Currently her pain is 10 of 10.

## 2017-09-21 ENCOUNTER — Encounter (HOSPITAL_COMMUNITY): Payer: Self-pay | Admitting: Emergency Medicine

## 2017-09-21 ENCOUNTER — Emergency Department (HOSPITAL_COMMUNITY)
Admission: EM | Admit: 2017-09-21 | Discharge: 2017-09-21 | Disposition: A | Discharge status: Home / Self Care | Payer: Self-pay | Attending: Emergency Medicine | Admitting: Emergency Medicine

## 2017-09-21 DIAGNOSIS — Z79899 Other long term (current) drug therapy: Secondary | ICD-10-CM | POA: Insufficient documentation

## 2017-09-21 DIAGNOSIS — H209 Unspecified iridocyclitis: Secondary | ICD-10-CM

## 2017-09-21 DIAGNOSIS — H2000 Unspecified acute and subacute iridocyclitis: Secondary | ICD-10-CM | POA: Insufficient documentation

## 2017-09-21 MED ORDER — NEOMYCIN-POLYMYXIN-DEXAMETH 3.5-10000-0.1 OP SUSP: 1.0000 [drp] | Freq: Four times a day (QID) | OPHTHALMIC | 0 refills | Status: AC

## 2017-09-21 MED ORDER — CYCLOPENTOLATE HCL 1 % OP SOLN: 1.0000 [drp] | Freq: Two times a day (BID) | OPHTHALMIC | 0 refills | Status: AC | PRN

## 2017-09-21 MED ORDER — FLUORESCEIN SODIUM 1 MG OP STRP
1.0000 | ORAL_STRIP | Freq: Once | OPHTHALMIC | Status: AC
  Administered 2017-09-21: 1 via OPHTHALMIC
  Filled 2017-09-21: qty 1

## 2017-09-21 MED ORDER — TETRACAINE HCL 0.5 % OP SOLN
1.0000 [drp] | Freq: Once | OPHTHALMIC | Status: AC
  Administered 2017-09-21: 1 [drp] via OPHTHALMIC
  Filled 2017-09-21: qty 4

## 2017-09-21 NOTE — Discharge Instructions (Signed)
Take maxitrol and cyclogyl as prescribed for eye pain. Wear sunglasses with these medications as exposure to sunlight may damage your eyes. Follow up with your ophthalmologist in the next 24-48 hours. Return to the ED if any concerning signs or symptoms develop.

## 2017-09-21 NOTE — ED Triage Notes (Signed)
Pt reports L eye pain that get worse with bright lights. Pt reports she has similar symptoms before.

## 2017-09-21 NOTE — ED Provider Notes (Signed)
[facility]-EMERGENCY DEPT Provider Note   CSN: [id number] Arrival date & time: 09/21/17  1146     History   Chief Complaint Chief Complaint  Patient presents with  . Eye Pain    HPI Peggy Weeks is a 21 y.o. female presents today with chief complaint acute onset, progressively worsening left eye pain and redness for 2 days.  Associated symptoms include clear tearful drainage, left-sided nasal congestion, and photophobia.  She denies fevers or chills.  Pain is throbbing in nature and does not radiate.  She denies any known trauma or foreign body sensation.  She does wear contacts but states "I think I wear them longer than I should.  They say you can sleep in them but this is the second time this has happened".  Pain worsens when she looks at screens and states she was unable to complete work today because she was unable to use the computer.  She has had similar symptoms in the past several months which was evaluated to be iritis of the right eye.  She is unsure if she has had any vision changes and states "my vision is just really bad in general ".  She has tried leftover cycloplegic drops with out significant relief  as well as ophthalmic ointment which she states was helpful but she ran out of this medication.   The history is provided by the patient.    History reviewed. No pertinent past medical history.  There are no active problems to display for this patient.   History reviewed. No pertinent surgical history.  OB History    No data available       Home Medications    Prior to Admission medications   Medication Sig Start Date End Date Taking? Authorizing Provider  acetaminophen-codeine (TYLENOL #3) 300-30 MG tablet Take 1 tablet by mouth once.    [provider]  cyclopentolate (CYCLODRYL,CYCLOGYL) 1 % ophthalmic solution Place 1 drop into the left eye 2 (two) times daily as needed (eye pain). 09/21/17   Eustacia Urbanek A, PA-C    etonogestrel (IMPLANON) 68 MG IMPL implant 1 each by Subdermal route once. Placed 12/2014    [provider]  HYDROcodone-acetaminophen (NORCO) 5-325 MG tablet Take 1 tablet by mouth every 6 (six) hours as needed for severe pain. 04/03/17   Doug SouJacubowitz, Sam, MD  neomycin-polymyxin b-dexamethasone (MAXITROL) 3.5-10000-0.1 SUSP Place 1 drop into the left eye every 6 (six) hours. 09/21/17   Jeanie SewerFawze, Jerine Surles A, PA-C    Family History History reviewed. No pertinent family history.  Social History Social History   Tobacco Use  . Smoking status: Never Smoker  . Smokeless tobacco: Never Used  Substance Use Topics  . Alcohol use: No  . Drug use: No     Allergies   Patient has no known allergies.   Review of Systems Review of Systems  Constitutional: Negative for chills and fever.  HENT: Positive for congestion. Negative for sore throat.   Eyes: Positive for photophobia, pain, discharge, redness and visual disturbance (? possible).  Respiratory: Negative for cough and shortness of breath.   Cardiovascular: Negative for chest pain.  Neurological: Negative for syncope and numbness.  All other systems reviewed and are negative.    Physical Exam Updated Vital Signs BP 123/85 (BP Location: Left Arm)   Pulse 80   Temp 97.9 F (36.6 C) (Oral)   Resp 17   Wt 59.4 kg (131 lb)   SpO2 99%   BMI 21.14  kg/m   Physical Exam  Constitutional: She appears well-developed and well-nourished. No distress.  HENT:  Head: Normocephalic and atraumatic.  Right Ear: External ear normal.  Left Ear: External ear normal.  Mouth/Throat: Oropharynx is clear and moist.  Mild left-sided mucosal edema, nasal septum is midline, posterior oropharynx is unremarkable, TMs without erythema or bulging bilaterally  Eyes: Pupils are equal, round, and reactive to light. Right eye exhibits no discharge. Left eye exhibits discharge.  Left eye with conjunctival injection, pupils are dilated and there is  consensual photophobia.  No chemosis, proptosis, or foreign bodies noted.  On fluorescein stain foreign bodies, rust rings, dendritic lesions, corneal abrasions or ulcerations.  Seidel sign absent  No pain with EOMs.  IOP in the left eye 13    Visual Acuity  Right Eye Distance: 20/50-1 Left Eye Distance: 20/50-1 Bilateral Distance: 20/50-1(with glasses)   Neck: Normal range of motion. Neck supple. No JVD present. No tracheal deviation present.  Cardiovascular: Normal rate.  Pulmonary/Chest: Effort normal.  Abdominal: She exhibits no distension.  Musculoskeletal: She exhibits no edema.  Neurological: She is alert.  Skin: Skin is warm and dry. No erythema.  Psychiatric: She has a normal mood and affect. Her behavior is normal.  Nursing note and vitals reviewed.    ED Treatments / Results  Labs (all labs ordered are listed, but only abnormal results are displayed) Labs Reviewed - No data to display  EKG  EKG Interpretation None       Radiology No results found.  Procedures Procedures (including critical care time)  Medications Ordered in ED Medications  fluorescein ophthalmic strip 1 strip (1 strip Left Eye Given 09/21/17 1241)  tetracaine (PONTOCAINE) 0.5 % ophthalmic solution 1 drop (1 drop Left Eye Given 09/21/17 1241)     Initial Impression / Assessment and Plan / ED Course  I have reviewed the triage vital signs and the nursing notes.  Pertinent labs & imaging results that were available during my care of the patient were reviewed by me and considered in my medical decision making (see chart for details).     Patient with physical examination concerning for iritis of the left eye.  Afebrile, vital signs are stable.  She has consensual photophobia.  Seen for the same a few months ago in the right eye.  She had followed up with Dr. Sherryll BurgerShah at that time and symptoms resolved within 2 days.  She is using leftover medication from the last time she had similar  symptoms.  No evidence of HSV, globe rupture, periorbital or preseptal cellulitis. Normal IOP. No evidence of corneal abrasion or ulceration or foreign body.  No evidence of acute angle-closure glaucoma.  Will discharge with cycloplegic medication and Maxitrol.  She will follow-up with her ophthalmologist in the next 24-48 hours.  Discussed indications for return to the ED. Pt verbalized understanding of and agreement with plan and is safe for discharge home at this time.   Final Clinical Impressions(s) / ED Diagnoses   Final diagnoses:  Iritis of left eye    ED Discharge Orders        Ordered    cyclopentolate (CYCLODRYL,CYCLOGYL) 1 % ophthalmic solution  2 times daily PRN     09/21/17 1322    neomycin-polymyxin b-dexamethasone (MAXITROL) 3.5-10000-0.1 SUSP  Every 6 hours     09/21/17 1322       Jeanie SewerFawze, Alys Dulak A, PA-C 09/21/17 1353    Alvira MondaySchlossman, Erin, MD 09/23/17 [id number]    Alvira MondaySchlossman, Erin, MD 09/23/17  1838  

## 2018-02-07 ENCOUNTER — Encounter (HOSPITAL_COMMUNITY): Payer: Self-pay | Admitting: Emergency Medicine

## 2018-02-07 ENCOUNTER — Emergency Department (HOSPITAL_COMMUNITY): Payer: Medicaid Other

## 2018-02-07 ENCOUNTER — Emergency Department (HOSPITAL_COMMUNITY)
Admission: EM | Admit: 2018-02-07 | Discharge: 2018-02-07 | Disposition: A | Discharge status: Home / Self Care | Payer: Medicaid Other | Attending: Emergency Medicine | Admitting: Emergency Medicine

## 2018-02-07 DIAGNOSIS — Y929 Unspecified place or not applicable: Secondary | ICD-10-CM | POA: Insufficient documentation

## 2018-02-07 DIAGNOSIS — Y939 Activity, unspecified: Secondary | ICD-10-CM | POA: Insufficient documentation

## 2018-02-07 DIAGNOSIS — Z79899 Other long term (current) drug therapy: Secondary | ICD-10-CM | POA: Insufficient documentation

## 2018-02-07 DIAGNOSIS — S62635A Displaced fracture of distal phalanx of left ring finger, initial encounter for closed fracture: Secondary | ICD-10-CM | POA: Insufficient documentation

## 2018-02-07 DIAGNOSIS — Y999 Unspecified external cause status: Secondary | ICD-10-CM | POA: Insufficient documentation

## 2018-02-07 DIAGNOSIS — W502XXA Accidental twist by another person, initial encounter: Secondary | ICD-10-CM | POA: Insufficient documentation

## 2018-02-07 MED ORDER — LIDOCAINE HCL 2 % IJ SOLN
10.0000 mL | Freq: Once | INTRAMUSCULAR | Status: AC
  Administered 2018-02-07: 200 mg via INTRADERMAL
  Filled 2018-02-07: qty 20

## 2018-02-07 MED ORDER — HYDROCODONE-ACETAMINOPHEN 5-325 MG PO TABS: 1.0000 | ORAL_TABLET | Freq: Four times a day (QID) | ORAL | 0 refills | Status: AC | PRN

## 2018-02-07 MED ORDER — TRAMADOL HCL 50 MG PO TABS
50.0000 mg | ORAL_TABLET | Freq: Once | ORAL | Status: AC
  Administered 2018-02-07: 50 mg via ORAL
  Filled 2018-02-07: qty 1
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
## 2018-09-01 IMAGING — DX DG FINGER RING 2+V*L*
4 series · 4 of 4 positions shown · non-contrast
Comparison: 08/28/2015

CLINICAL DATA: Left ring finger injury, pain

EXAM:
LEFT RING FINGER 2+V

[finger ap (1 of 2)]
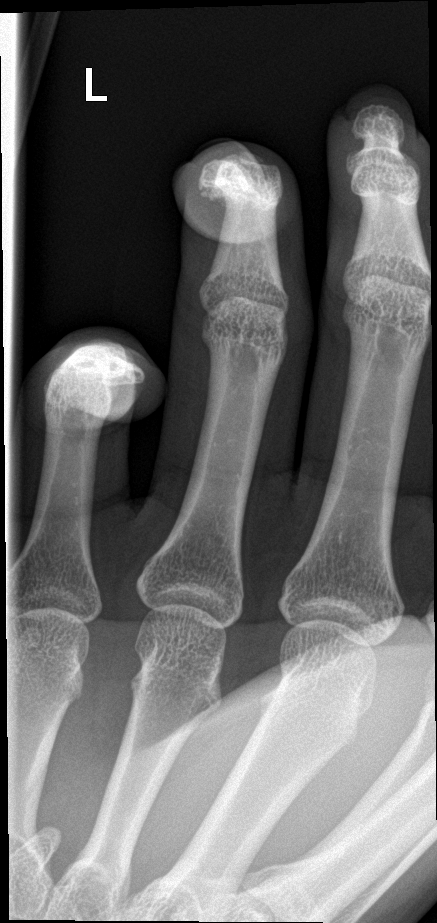

[finger obl]
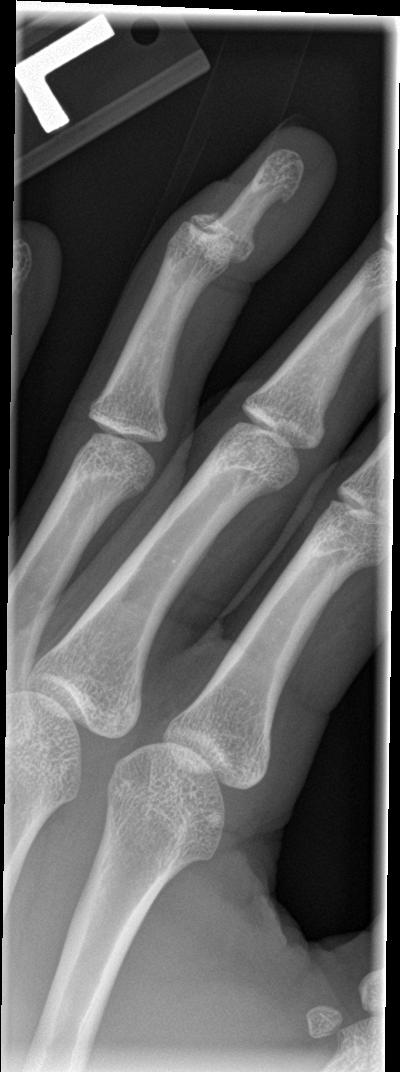

[finger lat]
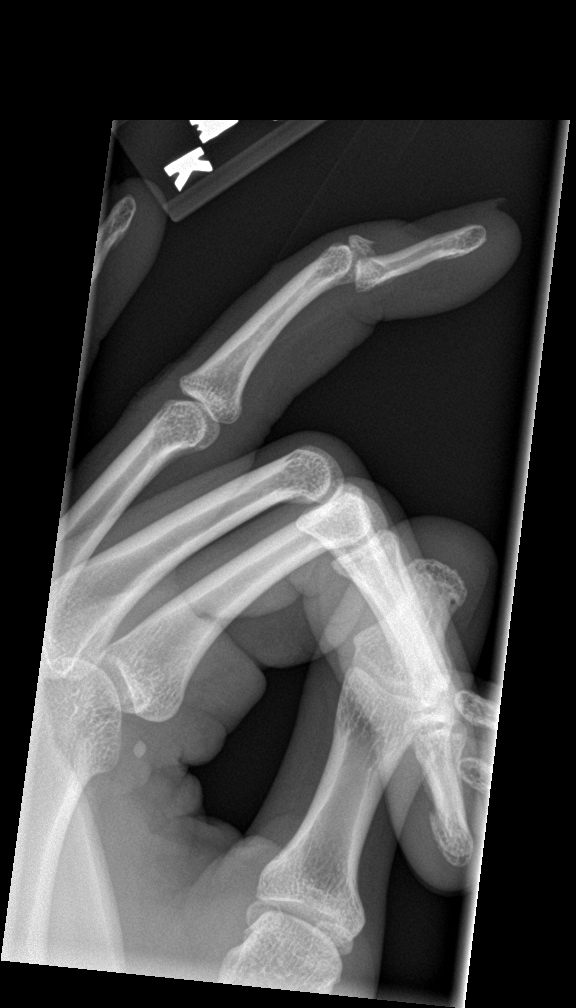

[finger ap (2 of 2)]
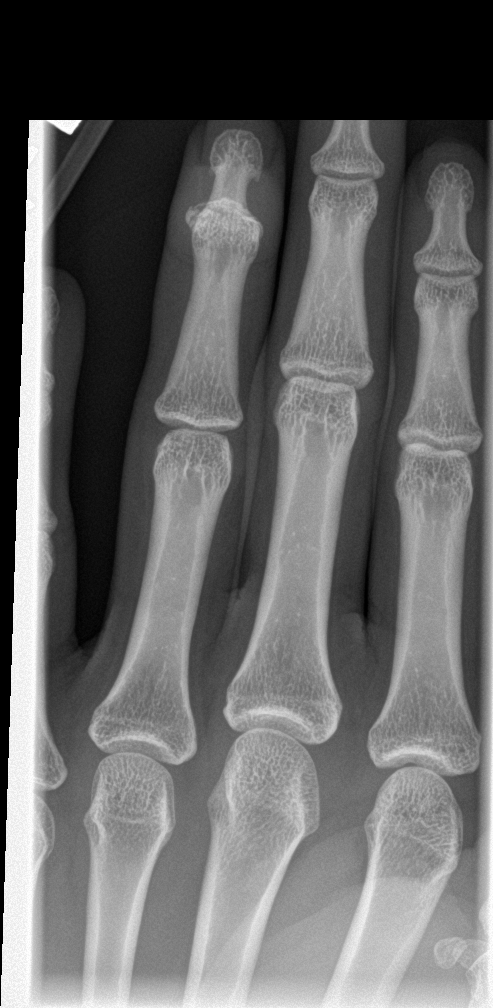

[4 of 4 positions shown; findings below may reference images not displayed]

FINDINGS: Fracture at the dorsal base of the left ring finger distal phalanx.
Approximate 3 mm displacement and subluxation at the DIP joint. Soft
tissues are intact.
IMPRESSION: Displaced fracture at the dorsal base of the left ring finger distal
phalanx with anterior subluxation.
